# Patient Record
Sex: Female | Born: 2000 | Race: White | Hispanic: No | Marital: Single | Smoking: Never smoker
Health system: Southern US, Community
[De-identification: ages and names within clinical notes are randomized; demographics above are authoritative.]

---
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
## 2020-07-24 DIAGNOSIS — R059 Cough, unspecified: Secondary | ICD-10-CM | POA: Diagnosis not present

## 2020-08-14 DIAGNOSIS — B9689 Other specified bacterial agents as the cause of diseases classified elsewhere: Secondary | ICD-10-CM | POA: Diagnosis not present

## 2020-08-14 DIAGNOSIS — L700 Acne vulgaris: Secondary | ICD-10-CM | POA: Diagnosis not present

## 2020-08-14 DIAGNOSIS — L02828 Furuncle of other sites: Secondary | ICD-10-CM | POA: Diagnosis not present

## 2022-03-27 ENCOUNTER — Ambulatory Visit (INDEPENDENT_AMBULATORY_CARE_PROVIDER_SITE_OTHER): Payer: BC Managed Care – PPO | Admitting: Family Medicine

## 2022-03-27 ENCOUNTER — Encounter: Payer: Self-pay | Admitting: Family Medicine

## 2022-03-27 VITALS — BP 136/83 | HR 80 | Ht 64.0 in | Wt 242.1 lb

## 2022-03-27 DIAGNOSIS — K5900 Constipation, unspecified: Secondary | ICD-10-CM | POA: Insufficient documentation

## 2022-03-27 DIAGNOSIS — Z1159 Encounter for screening for other viral diseases: Secondary | ICD-10-CM

## 2022-03-27 DIAGNOSIS — K5904 Chronic idiopathic constipation: Secondary | ICD-10-CM | POA: Diagnosis not present

## 2022-03-27 DIAGNOSIS — Z114 Encounter for screening for human immunodeficiency virus [HIV]: Secondary | ICD-10-CM | POA: Diagnosis not present

## 2022-03-27 DIAGNOSIS — Z2821 Immunization not carried out because of patient refusal: Secondary | ICD-10-CM | POA: Diagnosis not present

## 2022-03-27 DIAGNOSIS — Z23 Encounter for immunization: Secondary | ICD-10-CM

## 2022-03-27 DIAGNOSIS — E559 Vitamin D deficiency, unspecified: Secondary | ICD-10-CM

## 2022-03-27 DIAGNOSIS — R7301 Impaired fasting glucose: Secondary | ICD-10-CM

## 2022-03-27 DIAGNOSIS — Z5320 Procedure and treatment not carried out because of patient's decision for unspecified reasons: Secondary | ICD-10-CM

## 2022-03-27 NOTE — Patient Instructions (Addendum)
.  I appreciate the opportunity to provide care to you today!    Follow up:  3 months  Labs: please stop by the lab during the week to get your blood drawn (CBC, CMP, TSH, Lipid profile, HgA1c, Vit D)  Screening: HIV and Hep C    Please continue to a heart-healthy diet and increase your physical activities. Try to exercise for at least three times a week.      It was a pleasure to see you and I look forward to continuing to work together on your health and well-being. Please do not hesitate to call the office if you need care or have questions about your care.   Have a wonderful day and week. With Gratitude, Gilmore Laroche MSN, FNP-BC

## 2022-03-27 NOTE — Progress Notes (Signed)
New Patient Office Visit  Subjective:  Patient ID: Vickie Craig, female    DOB: [birth year]  Age: 21 y.o. MRN: [id number]  CC:  Chief Complaint  Patient presents with   New Patient (Initial Visit)    Establishing care. Due for pap, has questions about this. C/o constipation problems.     HPI Vickie Craig is a 21 y.o. female with past medical history of idiopathic constipation presents for establishing care. Pap smears: She reports not wanting a pap exam, stating that she has been getting pap exams since she was 21 years old during her GYN annual.  Chronic idiopathic constipation: c/o of constipation of unknown etiology. She noted imaging studies showing impacted stools in her colon when receiving care in [place]. She reported a bowel movement today and was noted to have BM once a week. She reports taking Dulcolax for symptom relief.  History reviewed. No pertinent past medical history.  History reviewed. No pertinent surgical history.  History reviewed. No pertinent family history.  Social History   Socioeconomic History   Marital status: Single    Spouse name: Not on file   Number of children: Not on file   Years of education: Not on file   Highest education level: Not on file  Occupational History   Not on file  Tobacco Use   Smoking status: Never   Smokeless tobacco: Not on file  Substance and Sexual Activity   Alcohol use: No   Drug use: No   Sexual activity: Not on file  Other Topics Concern   Not on file  Social History Narrative   Not on file   Social Determinants of Health   Financial Resource Strain: Not on file  Food Insecurity: Not on file  Transportation Needs: Not on file  Physical Activity: Not on file  Stress: Not on file  Social Connections: Not on file  Intimate Partner Violence: Not on file    ROS Review of Systems  Constitutional:  Negative for fatigue and fever.  HENT:  Negative for sinus pressure, sinus pain and sore throat.    Eyes:  Negative for photophobia, redness and visual disturbance.  Respiratory:  Negative for chest tightness and shortness of breath.   Cardiovascular:  Negative for chest pain and palpitations.  Gastrointestinal:  Negative for abdominal distention and abdominal pain.  Endocrine: Negative for polydipsia, polyphagia and polyuria.  Genitourinary:  Negative for decreased urine volume, vaginal bleeding and vaginal discharge.  Musculoskeletal:  Negative for gait problem and neck pain.  Skin:  Negative for rash and wound.  Neurological:  Negative for dizziness, weakness and headaches.  Hematological:  Does not bruise/bleed easily.  Psychiatric/Behavioral:  Negative for self-injury and suicidal ideas.     Objective:   Today's Vitals: BP 136/83   Pulse 80   Ht 5' 4"  (1.626 m)   Wt 242 lb 1.9 oz (109.8 kg)   LMP 03/06/2022   SpO2 98%   BMI 41.56 kg/m   Physical Exam HENT:     Head: Normocephalic.     Right Ear: External ear normal.     Left Ear: External ear normal.     Nose: No congestion.     Mouth/Throat:     Pharynx: No oropharyngeal exudate.  Eyes:     Extraocular Movements: Extraocular movements intact.     Pupils: Pupils are equal, round, and reactive to light.  Cardiovascular:     Rate and Rhythm: Normal rate and regular rhythm.  Pulses: Normal pulses.     Heart sounds: Normal heart sounds.  Pulmonary:     Effort: Pulmonary effort is normal.  Abdominal:     Palpations: Abdomen is soft.     Tenderness: There is no right CVA tenderness or left CVA tenderness.  Musculoskeletal:     Cervical back: No rigidity.     Right lower leg: No edema.     Left lower leg: No edema.  Skin:    General: Skin is warm and dry.  Neurological:     Mental Status: She is alert and oriented to person, place, and time.  Psychiatric:     Comments: Normal affect     Assessment & Plan:   Problem List Items Addressed This Visit       Other   Constipation - Primary    Encouraged  to continue using Dulcolax as needed for symptom relief Encouraged her to continue increasing her fluids and fiber intake       Cervical cancer screening declined    She reports not wanting a pap exam, stating that she has been getting pap exams since she was 21 years old during her GYN annual  informed the patient that screening is every 3 years to assess for changes in the cervical cells Pt verbalized understanding and declined screening       Other Visit Diagnoses     Immunization due       Relevant Orders   Tdap vaccine greater than or equal to 7yo IM (Completed)   Refused influenza vaccine       Need for hepatitis C screening test       Relevant Orders   Hepatitis C Antibody   Encounter for screening for HIV       Relevant Orders   HIV antibody (with reflex)   Vitamin D deficiency       Relevant Orders   VITAMIN D 25 Hydroxy (Vit-D Deficiency, Fractures)   IFG (impaired fasting glucose)       Relevant Orders   CBC with Differential/Platelet   TSH + free T4   Lipid panel   Hemoglobin A1C   CMP14+EGFR       No outpatient encounter medications on file as of 03/27/2022.   No facility-administered encounter medications on file as of 03/27/2022.    Follow-up: Return in about 3 months (around 06/26/2022).   Alvira Monday, FNP

## 2022-03-27 NOTE — Assessment & Plan Note (Signed)
She reports not wanting a pap exam, stating that she has been getting pap exams since she was 21 years old during her GYN annual  informed the patient that screening is every 3 years to assess for changes in the cervical cells Pt verbalized understanding and declined screening

## 2022-03-27 NOTE — Assessment & Plan Note (Addendum)
Encouraged to continue using Dulcolax as needed for symptom relief Encouraged her to continue increasing her fluids and fiber intake

## 2022-06-26 ENCOUNTER — Ambulatory Visit: Payer: BC Managed Care – PPO | Admitting: Family Medicine

## 2022-07-08 ENCOUNTER — Encounter: Payer: Self-pay | Admitting: Family Medicine

## 2022-07-08 ENCOUNTER — Ambulatory Visit (INDEPENDENT_AMBULATORY_CARE_PROVIDER_SITE_OTHER): Payer: BC Managed Care – PPO | Admitting: Family Medicine

## 2022-07-08 VITALS — BP 119/78 | HR 92 | Ht 64.0 in | Wt 238.1 lb

## 2022-07-08 DIAGNOSIS — R112 Nausea with vomiting, unspecified: Secondary | ICD-10-CM

## 2022-07-08 DIAGNOSIS — R1084 Generalized abdominal pain: Secondary | ICD-10-CM | POA: Diagnosis not present

## 2022-07-08 DIAGNOSIS — Z1159 Encounter for screening for other viral diseases: Secondary | ICD-10-CM | POA: Diagnosis not present

## 2022-07-08 DIAGNOSIS — R7301 Impaired fasting glucose: Secondary | ICD-10-CM | POA: Diagnosis not present

## 2022-07-08 DIAGNOSIS — E559 Vitamin D deficiency, unspecified: Secondary | ICD-10-CM | POA: Diagnosis not present

## 2022-07-08 DIAGNOSIS — R109 Unspecified abdominal pain: Secondary | ICD-10-CM | POA: Insufficient documentation

## 2022-07-08 DIAGNOSIS — Z114 Encounter for screening for human immunodeficiency virus [HIV]: Secondary | ICD-10-CM | POA: Diagnosis not present

## 2022-07-08 LAB — POCT URINALYSIS DIP (CLINITEK)
Bilirubin, UA: NEGATIVE
Blood, UA: NEGATIVE
Glucose, UA: NEGATIVE mg/dL
Ketones, POC UA: NEGATIVE mg/dL
Nitrite, UA: NEGATIVE
POC PROTEIN,UA: NEGATIVE
Spec Grav, UA: 1.02 (ref 1.010–1.025)
Urobilinogen, UA: 0.2 E.U./dL
pH, UA: 6.5 (ref 5.0–8.0)

## 2022-07-08 MED ORDER — OMEPRAZOLE 20 MG PO CPDR: 20.0000 mg | DELAYED_RELEASE_CAPSULE | Freq: Every day | ORAL | 3 refills | Status: DC

## 2022-07-08 MED ORDER — ONDANSETRON HCL 4 MG PO TABS: 4.0000 mg | ORAL_TABLET | Freq: Three times a day (TID) | ORAL | 0 refills | Status: AC | PRN

## 2022-07-08 NOTE — Assessment & Plan Note (Signed)
UA is negative for cystitis We will start a trial of PPI Zofran was ordered for nausea and vomiting Encouraged to call to schedule an appointment for worsening symptoms

## 2022-07-08 NOTE — Progress Notes (Signed)
Acute Office Visit  Subjective:    Patient ID: Vickie Craig, female    DOB: 2001/02/14, 21 y.o.   MRN: [id number]  Chief Complaint  Patient presents with   Abdominal Pain    Pt reports stabbing pain around her belly button every time she eats she either feels nauseous or pain comes on and stools have been abnormal, has been using pepto bismol and other otc meds nothing helps , onset of sx began around 06/11/2022.    HPI Patient is in today with complaints of abdominal pain.  Abdominal pain: She denies fever and chills.  She complains of a stabbing pain around her umbilicus with the onset of food.  She complains of nausea and vomiting with symptoms.  She reports that her pain intensifies with the onset of  bowel movement.  History reviewed. No pertinent past medical history.  History reviewed. No pertinent surgical history.  History reviewed. No pertinent family history.  Social History   Socioeconomic History   Marital status: Single    Spouse name: Not on file   Number of children: Not on file   Years of education: Not on file   Highest education level: Not on file  Occupational History   Not on file  Tobacco Use   Smoking status: Never   Smokeless tobacco: Not on file  Substance and Sexual Activity   Alcohol use: No   Drug use: No   Sexual activity: Not on file  Other Topics Concern   Not on file  Social History Narrative   Not on file   Social Determinants of Health   Financial Resource Strain: Not on file  Food Insecurity: Not on file  Transportation Needs: Not on file  Physical Activity: Not on file  Stress: Not on file  Social Connections: Not on file  Intimate Partner Violence: Not on file    No outpatient medications prior to visit.   No facility-administered medications prior to visit.    No Known Allergies  Review of Systems  Constitutional:  Negative for chills and fever.  Eyes:  Negative for visual disturbance.  Respiratory:  Negative  for chest tightness and shortness of breath.   Gastrointestinal:  Positive for abdominal pain, nausea and vomiting.  Musculoskeletal:  Negative for back pain.  Neurological:  Negative for dizziness and headaches.       Objective:    Physical Exam HENT:     Head: Normocephalic.     Mouth/Throat:     Mouth: Mucous membranes are moist.  Cardiovascular:     Rate and Rhythm: Normal rate.     Heart sounds: Normal heart sounds.  Pulmonary:     Effort: Pulmonary effort is normal.     Breath sounds: Normal breath sounds.  Abdominal:     Tenderness: There is no abdominal tenderness. There is no right CVA tenderness or left CVA tenderness.  Neurological:     Mental Status: She is alert.     BP 119/78   Pulse 92   Ht 5\' 4"  (1.626 m)   Wt 238 lb 1.3 oz (108 kg)   SpO2 97%   BMI 40.87 kg/m  Wt Readings from Last 3 Encounters:  07/08/22 238 lb 1.3 oz (108 kg)  03/27/22 242 lb 1.9 oz (109.8 kg)  03/29/15 215 lb 12.8 oz (97.9 kg) (99 %, Z= 2.22)*   * Growth percentiles are based on CDC (Girls, 2-20 Years) data.       Assessment & Plan:  Generalized abdominal pain Assessment & Plan: UA is negative for cystitis We will start a trial of PPI Zofran was ordered for nausea and vomiting Encouraged to call to schedule an appointment for worsening symptoms  Orders: -     POCT URINALYSIS DIP (CLINITEK) -     Omeprazole; Take 1 capsule (20 mg total) by mouth daily.  Dispense: 30 capsule; Refill: 3  Nausea and vomiting, unspecified vomiting type -     Ondansetron HCl; Take 1 tablet (4 mg total) by mouth every 8 (eight) hours as needed for nausea or vomiting.  Dispense: 30 tablet; Refill: 0    Gilmore Laroche, FNP

## 2022-07-08 NOTE — Patient Instructions (Addendum)
I appreciate the opportunity to provide care to you today!    Follow up:  3 months  A prescription for Zofran 4 mg to take every 8 hours as needed for nausea and vomiting has been sent to your pharmacy  I will start you on a trial of omeprazole 20 mg daily to take epigastric domino pain  I recommend staying well-hydrated and increasing her fluid intake, at least 64 ounces of fluids daily  I also recommend taking over-the-counter metamucil fiber for constipation 2.5 to 30 g/day    Please continue to a heart-healthy diet and increase your physical activities. Try to exercise for at least three times a week.      It was a pleasure to see you and I look forward to continuing to work together on your health and well-being. Please do not hesitate to call the office if you need care or have questions about your care.   Have a wonderful day and week. With Gratitude, Gilmore Laroche MSN, FNP-BC

## 2022-07-09 LAB — CMP14+EGFR
ALT: 20 IU/L (ref 0–32)
AST: 13 IU/L (ref 0–40)
Albumin/Globulin Ratio: 2.1 (ref 1.2–2.2)
Albumin: 4.9 g/dL (ref 4.0–5.0)
Alkaline Phosphatase: 90 IU/L (ref 44–121)
BUN/Creatinine Ratio: 15 (ref 9–23)
BUN: 12 mg/dL (ref 6–20)
Bilirubin Total: 0.4 mg/dL (ref 0.0–1.2)
CO2: 23 mmol/L (ref 20–29)
Calcium: 10 mg/dL (ref 8.7–10.2)
Chloride: 99 mmol/L (ref 96–106)
Creatinine, Ser: 0.78 mg/dL (ref 0.57–1.00)
Globulin, Total: 2.3 g/dL (ref 1.5–4.5)
Glucose: 93 mg/dL (ref 70–99)
Potassium: 4.8 mmol/L (ref 3.5–5.2)
Sodium: 138 mmol/L (ref 134–144)
Total Protein: 7.2 g/dL (ref 6.0–8.5)
eGFR: 109 mL/min/{1.73_m2} (ref >59)

## 2022-07-09 LAB — CBC WITH DIFFERENTIAL/PLATELET
Basophils Absolute: 0 10*3/uL (ref 0.0–0.2)
Basos: 1 %
EOS (ABSOLUTE): 0.1 10*3/uL (ref 0.0–0.4)
Eos: 1 %
Hematocrit: 41.1 % (ref 34.0–46.6)
Hemoglobin: 13.2 g/dL (ref 11.1–15.9)
Immature Grans (Abs): 0 10*3/uL (ref 0.0–0.1)
Immature Granulocytes: 0 %
Lymphocytes Absolute: 1.5 10*3/uL (ref 0.7–3.1)
Lymphs: 23 %
MCH: 26.9 pg (ref 26.6–33.0)
MCHC: 32.1 g/dL (ref 31.5–35.7)
MCV: 84 fL (ref 79–97)
Monocytes Absolute: 0.5 10*3/uL (ref 0.1–0.9)
Monocytes: 7 %
Neutrophils Absolute: 4.5 10*3/uL (ref 1.4–7.0)
Neutrophils: 68 %
Platelets: 249 10*3/uL (ref 150–450)
RBC: 4.9 x10E6/uL (ref 3.77–5.28)
RDW: 13.2 % (ref 11.7–15.4)
WBC: 6.6 10*3/uL (ref 3.4–10.8)

## 2022-07-09 LAB — LIPID PANEL
Chol/HDL Ratio: 3.3 ratio (ref 0.0–4.4)
Cholesterol, Total: 169 mg/dL (ref 100–199)
HDL: 52 mg/dL (ref >39)
LDL Chol Calc (NIH): 102 mg/dL — ABNORMAL HIGH (ref 0–99)
Triglycerides: 80 mg/dL (ref 0–149)
VLDL Cholesterol Cal: 15 mg/dL (ref 5–40)

## 2022-07-09 LAB — TSH+FREE T4
Free T4: 1.22 ng/dL (ref 0.82–1.77)
TSH: 1.29 u[IU]/mL (ref 0.450–4.500)

## 2022-07-09 LAB — VITAMIN D 25 HYDROXY (VIT D DEFICIENCY, FRACTURES): Vit D, 25-Hydroxy: 20.5 ng/mL — ABNORMAL LOW (ref 30.0–100.0)

## 2022-07-09 LAB — HIV ANTIBODY (ROUTINE TESTING W REFLEX): HIV Screen 4th Generation wRfx: NONREACTIVE

## 2022-07-09 LAB — HEPATITIS C ANTIBODY: Hep C Virus Ab: NONREACTIVE

## 2022-07-09 LAB — HEMOGLOBIN A1C
Est. average glucose Bld gHb Est-mCnc: 100 mg/dL
Hgb A1c MFr Bld: 5.1 % (ref 4.8–5.6)

## 2022-07-14 NOTE — Progress Notes (Signed)
Please inform the patient that her vitamin D is slightly low; I recommend that she start taking vitamin D at 1000 IU D daily over-the-counter.  Her LDL cholesterol is slightly elevated; I recommend a low carbs and saturated fat diet with increased physical activities.  All other labs are stable

## 2022-07-21 ENCOUNTER — Other Ambulatory Visit: Payer: Self-pay | Admitting: Family Medicine

## 2022-07-21 ENCOUNTER — Encounter: Payer: Self-pay | Admitting: Family Medicine

## 2022-07-21 DIAGNOSIS — E559 Vitamin D deficiency, unspecified: Secondary | ICD-10-CM

## 2022-07-21 MED ORDER — VITAMIN D (ERGOCALCIFEROL) 1.25 MG (50000 UNIT) PO CAPS: 50000.0000 [IU] | ORAL_CAPSULE | ORAL | 2 refills | Status: AC

## 2022-07-28 ENCOUNTER — Ambulatory Visit: Payer: BC Managed Care – PPO | Admitting: Family Medicine

## 2022-10-07 ENCOUNTER — Ambulatory Visit: Payer: BC Managed Care – PPO | Admitting: Family Medicine

## 2022-10-21 ENCOUNTER — Encounter: Payer: Self-pay | Admitting: Family Medicine

## 2022-10-21 ENCOUNTER — Ambulatory Visit (INDEPENDENT_AMBULATORY_CARE_PROVIDER_SITE_OTHER): Payer: BC Managed Care – PPO | Admitting: Family Medicine

## 2022-10-21 VITALS — BP 132/84 | HR 95 | Ht 64.0 in | Wt 238.0 lb

## 2022-10-21 DIAGNOSIS — K5909 Other constipation: Secondary | ICD-10-CM | POA: Diagnosis not present

## 2022-10-21 DIAGNOSIS — R1084 Generalized abdominal pain: Secondary | ICD-10-CM

## 2022-10-21 DIAGNOSIS — R7301 Impaired fasting glucose: Secondary | ICD-10-CM | POA: Diagnosis not present

## 2022-10-21 DIAGNOSIS — E7849 Other hyperlipidemia: Secondary | ICD-10-CM

## 2022-10-21 DIAGNOSIS — E559 Vitamin D deficiency, unspecified: Secondary | ICD-10-CM | POA: Diagnosis not present

## 2022-10-21 MED ORDER — OMEPRAZOLE 40 MG PO CPDR
40.0000 mg | DELAYED_RELEASE_CAPSULE | Freq: Every day | ORAL | 3 refills | Status: AC
Start: 2022-10-21 — End: ?

## 2022-10-21 NOTE — Patient Instructions (Addendum)
I appreciate the opportunity to provide care to you today!    Follow up:  6 weeks  +  Labs: please stop by the lab during the week to get your blood drawn (CBC, CMP, TSH, Lipid profile, HgA1c, Vit D)  Please pick up your medication at the pharmacy   Please continue to a heart-healthy diet and increase your physical activities. Try to exercise for 42mins at least five days a week.      It was a pleasure to see you and I look forward to continuing to work together on your health and well-being. Please do not hesitate to call the office if you need care or have questions about your care.   Have a wonderful day and week. With Gratitude, Alvira Monday MSN, FNP-BC

## 2022-10-21 NOTE — Progress Notes (Signed)
Established Patient Office Visit  Subjective:  Patient ID: Vickie Craig, female    DOB: 2001/01/14  Age: 22 y.o. MRN: [id number]  CC:  Chief Complaint  Patient presents with   Follow-up    3 month f/u, pt reports still having abdominal pain around mid area worsened since thanksgiving.     HPI Vickie Craig is a 21 y.o. female with past medical history of constipation and abdominal pain presents for f/u of  chronic medical conditions.  History reviewed. No pertinent past medical history.  History reviewed. No pertinent surgical history.  History reviewed. No pertinent family history.  Social History   Socioeconomic History   Marital status: Single    Spouse name: Not on file   Number of children: Not on file   Years of education: Not on file   Highest education level: Not on file  Occupational History   Not on file  Tobacco Use   Smoking status: Never   Smokeless tobacco: Not on file  Substance and Sexual Activity   Alcohol use: No   Drug use: No   Sexual activity: Not on file  Other Topics Concern   Not on file  Social History Narrative   Not on file   Social Determinants of Health   Financial Resource Strain: Not on file  Food Insecurity: Not on file  Transportation Needs: Not on file  Physical Activity: Not on file  Stress: Not on file  Social Connections: Not on file  Intimate Partner Violence: Not on file    Outpatient Medications Prior to Visit  Medication Sig Dispense Refill   ondansetron (ZOFRAN) 4 MG tablet Take 1 tablet (4 mg total) by mouth every 8 (eight) hours as needed for nausea or vomiting. 30 tablet 0   Vitamin D, Ergocalciferol, (DRISDOL) 1.25 MG (50000 UNIT) CAPS capsule Take 1 capsule (50,000 Units total) by mouth every 7 (seven) days. 10 capsule 2   omeprazole (PRILOSEC) 20 MG capsule Take 1 capsule (20 mg total) by mouth daily. 30 capsule 3   No facility-administered medications prior to visit.    No Known  Allergies  ROS Review of Systems  Constitutional:  Negative for chills, fatigue and fever.  Respiratory:  Negative for shortness of breath.   Cardiovascular:  Negative for chest pain and palpitations.  Gastrointestinal:  Positive for abdominal pain. Negative for abdominal distention, blood in stool, constipation, nausea and vomiting.  Endocrine: Negative for polydipsia, polyphagia and polyuria.  Genitourinary:  Negative for frequency and urgency.      Objective:    Physical Exam HENT:     Head: Normocephalic.     Mouth/Throat:     Mouth: Mucous membranes are moist.  Cardiovascular:     Rate and Rhythm: Normal rate.     Heart sounds: Normal heart sounds.  Pulmonary:     Effort: Pulmonary effort is normal.     Breath sounds: Normal breath sounds.  Neurological:     Mental Status: She is alert.     BP 132/84   Pulse 95   Ht 5\' 4"  (1.626 m)   Wt 238 lb (108 kg)   SpO2 97%   BMI 40.85 kg/m  Wt Readings from Last 3 Encounters:  10/21/22 238 lb (108 kg)  07/08/22 238 lb 1.3 oz (108 kg)  03/27/22 242 lb 1.9 oz (109.8 kg)    Lab Results  Component Value Date   TSH 1.290 07/08/2022   Lab Results  Component Value Date  WBC 6.6 07/08/2022   HGB 13.2 07/08/2022   HCT 41.1 07/08/2022   MCV 84 07/08/2022   PLT 249 07/08/2022   Lab Results  Component Value Date   NA 138 07/08/2022   K 4.8 07/08/2022   CO2 23 07/08/2022   GLUCOSE 93 07/08/2022   BUN 12 07/08/2022   CREATININE 0.78 07/08/2022   BILITOT 0.4 07/08/2022   ALKPHOS 90 07/08/2022   AST 13 07/08/2022   ALT 20 07/08/2022   PROT 7.2 07/08/2022   ALBUMIN 4.9 07/08/2022   CALCIUM 10.0 07/08/2022   EGFR 109 07/08/2022   Lab Results  Component Value Date   CHOL 169 07/08/2022   Lab Results  Component Value Date   HDL 52 07/08/2022   Lab Results  Component Value Date   LDLCALC 102 (H) 07/08/2022   Lab Results  Component Value Date   TRIG 80 07/08/2022   Lab Results  Component Value Date    CHOLHDL 3.3 07/08/2022   Lab Results  Component Value Date   HGBA1C 5.1 07/08/2022      Assessment & Plan:  Generalized abdominal pain Assessment & Plan: Complains of lower abdominal pain since Thanksgiving Negative rebound tenderness, negative psoas sign, negative obturator sign Symptom likely due to increased stomach acid Patient reports family history of colon cancer Reports some relief of her symptoms with omeprazole 20 mg daily Will increase omeprazole to 40 mg daily Denies nausea, vomiting, fever, chills, and changes in bowel habits Will get imaging studies due to ongoing discomfort in the abdomen and patient reports of family history of colon cancer  Orders: -     CT ABDOMEN PELVIS W WO CONTRAST -     Omeprazole; Take 1 capsule (40 mg total) by mouth daily.  Dispense: 30 capsule; Refill: 3  Other constipation Assessment & Plan: Stable Reports having bowel movements every other day No complaints of concerns voiced   IFG (impaired fasting glucose) -     Hemoglobin A1c  Vitamin D deficiency -     VITAMIN D 25 Hydroxy (Vit-D Deficiency, Fractures)  Other hyperlipidemia -     TSH + free T4 -     Lipid panel -     CMP14+EGFR -     CBC with Differential/Platelet    Follow-up: Return in about 6 weeks (around 12/02/2022).   Alvira Monday, FNP

## 2022-10-21 NOTE — Assessment & Plan Note (Signed)
Complains of lower abdominal pain since Thanksgiving Negative rebound tenderness, negative psoas sign, negative obturator sign Symptom likely due to increased stomach acid Patient reports family history of colon cancer Reports some relief of her symptoms with omeprazole 20 mg daily Will increase omeprazole to 40 mg daily Denies nausea, vomiting, fever, chills, and changes in bowel habits Will get imaging studies due to ongoing discomfort in the abdomen and patient reports of family history of colon cancer

## 2022-10-21 NOTE — Assessment & Plan Note (Signed)
Stable Reports having bowel movements every other day No complaints of concerns voiced

## 2022-10-28 ENCOUNTER — Other Ambulatory Visit: Payer: Self-pay

## 2022-10-28 ENCOUNTER — Telehealth: Payer: Self-pay | Admitting: Family Medicine

## 2022-10-28 DIAGNOSIS — R1084 Generalized abdominal pain: Secondary | ICD-10-CM

## 2022-10-28 DIAGNOSIS — K5904 Chronic idiopathic constipation: Secondary | ICD-10-CM

## 2022-10-28 NOTE — Telephone Encounter (Signed)
Your request for CT Abdomen/Pelvis Combination does not meet medical necessity criteria based on the information provided. Please review the Clinical Criteria information specific to this exam below.   Exam CT Abdomen/Pelvis Combination    CLINICAL CRITERIA  The criteria below may help you determine if this exam is clinically appropriate.  Advanced imaging of both abdomen and pelvis may be indicated in certain clinical scenarios. For symptoms or findings localized to the upper abdomen or to the pelvis, combination imaging is generally not necessary.

## 2022-10-29 NOTE — Telephone Encounter (Signed)
Thank you :)

## 2022-11-01 NOTE — Progress Notes (Deleted)
Referring Provider: Gilmore Laroche, FNP Primary Care Physician:  Gilmore Laroche, FNP Primary Gastroenterologist:  Dr. Bonnetta Barry chief complaint on file.   HPI:   Vickie Craig is a 22 y.o. female presenting today at the request of Gilmore Laroche, FNP for generalized abdominal pain and chronic idiopathic constipation.  Patient saw PCP 10/21/2022 for lower abdominal pain since Thanksgiving.  Patient reported some relief with omeprazole 20 mg daily.  Omeprazole was increased to 40 mg daily. CT A/P with and without contrast was ordered along with labs including CBC, CMP, TSH, vitamin D, hemoglobin A1c, lipid panel, but have not been completed.  Last labs on file 07/08/2022.  No significant abnormalities on CBC, CMP.  Today:   No past medical history on file.  No past surgical history on file.  Current Outpatient Medications  Medication Sig Dispense Refill   omeprazole (PRILOSEC) 40 MG capsule Take 1 capsule (40 mg total) by mouth daily. 30 capsule 3   ondansetron (ZOFRAN) 4 MG tablet Take 1 tablet (4 mg total) by mouth every 8 (eight) hours as needed for nausea or vomiting. 30 tablet 0   Vitamin D, Ergocalciferol, (DRISDOL) 1.25 MG (50000 UNIT) CAPS capsule Take 1 capsule (50,000 Units total) by mouth every 7 (seven) days. 10 capsule 2   No current facility-administered medications for this visit.    Allergies as of 11/04/2022   (No Known Allergies)    No family history on file.  Social History   Socioeconomic History   Marital status: Single    Spouse name: Not on file   Number of children: Not on file   Years of education: Not on file   Highest education level: Not on file  Occupational History   Not on file  Tobacco Use   Smoking status: Never   Smokeless tobacco: Not on file  Substance and Sexual Activity   Alcohol use: No   Drug use: No   Sexual activity: Not on file  Other Topics Concern   Not on file  Social History Narrative   Not on file   Social  Determinants of Health   Financial Resource Strain: Not on file  Food Insecurity: Not on file  Transportation Needs: Not on file  Physical Activity: Not on file  Stress: Not on file  Social Connections: Not on file  Intimate Partner Violence: Not on file    Review of Systems: Gen: Denies any fever, chills, fatigue, weight loss, lack of appetite.  CV: Denies chest pain, heart palpitations, peripheral edema, syncope.  Resp: Denies shortness of breath at rest or with exertion. Denies wheezing or cough.  GI: Denies dysphagia or odynophagia. Denies jaundice, hematemesis, fecal incontinence. GU : Denies urinary burning, urinary frequency, urinary hesitancy MS: Denies joint pain, muscle weakness, cramps, or limitation of movement.  Derm: Denies rash, itching, dry skin Psych: Denies depression, anxiety, memory loss, and confusion Heme: Denies bruising, bleeding, and enlarged lymph nodes.  Physical Exam: There were no vitals taken for this visit. General:   Alert and oriented. Pleasant and cooperative. Well-nourished and well-developed.  Head:  Normocephalic and atraumatic. Eyes:  Without icterus, sclera clear and conjunctiva pink.  Ears:  Normal auditory acuity. Lungs:  Clear to auscultation bilaterally. No wheezes, rales, or rhonchi. No distress.  Heart:  S1, S2 present without murmurs appreciated.  Abdomen:  +BS, soft, non-tender and non-distended. No HSM noted. No guarding or rebound. No masses appreciated.  Rectal:  Deferred  Msk:  Symmetrical without gross deformities.  Normal posture. Extremities:  Without edema. Neurologic:  Alert and  oriented x4;  grossly normal neurologically. Skin:  Intact without significant lesions or rashes. Psych:  Alert and cooperative. Normal mood and affect.    Assessment:     Plan:  ***   Ermalinda Memos, PA-C [facility] 11/04/2022

## 2022-11-02 ENCOUNTER — Ambulatory Visit (HOSPITAL_BASED_OUTPATIENT_CLINIC_OR_DEPARTMENT_OTHER): Payer: BC Managed Care – PPO

## 2022-11-02 DIAGNOSIS — E7849 Other hyperlipidemia: Secondary | ICD-10-CM | POA: Diagnosis not present

## 2022-11-02 DIAGNOSIS — E559 Vitamin D deficiency, unspecified: Secondary | ICD-10-CM | POA: Diagnosis not present

## 2022-11-02 DIAGNOSIS — R7301 Impaired fasting glucose: Secondary | ICD-10-CM | POA: Diagnosis not present

## 2022-11-03 LAB — VITAMIN D 25 HYDROXY (VIT D DEFICIENCY, FRACTURES): Vit D, 25-Hydroxy: 17.2 ng/mL — ABNORMAL LOW (ref 30.0–100.0)

## 2022-11-03 LAB — CMP14+EGFR
ALT: 26 IU/L (ref 0–32)
AST: 16 IU/L (ref 0–40)
Albumin/Globulin Ratio: 1.9 (ref 1.2–2.2)
Albumin: 4.3 g/dL (ref 4.0–5.0)
Alkaline Phosphatase: 95 IU/L (ref 44–121)
BUN/Creatinine Ratio: 15 (ref 9–23)
BUN: 11 mg/dL (ref 6–20)
Bilirubin Total: 0.3 mg/dL (ref 0.0–1.2)
CO2: 23 mmol/L (ref 20–29)
Calcium: 9.8 mg/dL (ref 8.7–10.2)
Chloride: 103 mmol/L (ref 96–106)
Creatinine, Ser: 0.72 mg/dL (ref 0.57–1.00)
Globulin, Total: 2.3 g/dL (ref 1.5–4.5)
Glucose: 97 mg/dL (ref 70–99)
Potassium: 4.3 mmol/L (ref 3.5–5.2)
Sodium: 139 mmol/L (ref 134–144)
Total Protein: 6.6 g/dL (ref 6.0–8.5)
eGFR: 120 mL/min/{1.73_m2} (ref >59)

## 2022-11-03 LAB — CBC WITH DIFFERENTIAL/PLATELET
Basophils Absolute: 0.1 10*3/uL (ref 0.0–0.2)
Basos: 1 %
EOS (ABSOLUTE): 0.2 10*3/uL (ref 0.0–0.4)
Eos: 3 %
Hematocrit: 39.8 % (ref 34.0–46.6)
Hemoglobin: 12.4 g/dL (ref 11.1–15.9)
Immature Grans (Abs): 0 10*3/uL (ref 0.0–0.1)
Immature Granulocytes: 0 %
Lymphocytes Absolute: 2 10*3/uL (ref 0.7–3.1)
Lymphs: 30 %
MCH: 25.9 pg — ABNORMAL LOW (ref 26.6–33.0)
MCHC: 31.2 g/dL — ABNORMAL LOW (ref 31.5–35.7)
MCV: 83 fL (ref 79–97)
Monocytes Absolute: 0.5 10*3/uL (ref 0.1–0.9)
Monocytes: 8 %
Neutrophils Absolute: 3.9 10*3/uL (ref 1.4–7.0)
Neutrophils: 58 %
Platelets: 258 10*3/uL (ref 150–450)
RBC: 4.79 x10E6/uL (ref 3.77–5.28)
RDW: 13.5 % (ref 11.7–15.4)
WBC: 6.7 10*3/uL (ref 3.4–10.8)

## 2022-11-03 LAB — LIPID PANEL
Chol/HDL Ratio: 3 ratio (ref 0.0–4.4)
Cholesterol, Total: 176 mg/dL (ref 100–199)
HDL: 58 mg/dL (ref >39)
LDL Chol Calc (NIH): 102 mg/dL — ABNORMAL HIGH (ref 0–99)
Triglycerides: 87 mg/dL (ref 0–149)
VLDL Cholesterol Cal: 16 mg/dL (ref 5–40)

## 2022-11-03 LAB — TSH+FREE T4
Free T4: 1.21 ng/dL (ref 0.82–1.77)
TSH: 2.95 u[IU]/mL (ref 0.450–4.500)

## 2022-11-03 LAB — HEMOGLOBIN A1C
Est. average glucose Bld gHb Est-mCnc: 103 mg/dL
Hgb A1c MFr Bld: 5.2 % (ref 4.8–5.6)

## 2022-11-04 ENCOUNTER — Ambulatory Visit: Payer: BC Managed Care – PPO | Admitting: Gastroenterology

## 2022-11-10 ENCOUNTER — Telehealth: Payer: Self-pay | Admitting: Family Medicine

## 2022-11-10 NOTE — Telephone Encounter (Signed)
Patient called said she was referred to [facility], she has tried calling now for 2 weeks and can not get in touch or schedule an appointment, patient is asking can she be referred to another [place]. Patient call back # [phone]

## 2022-11-30 ENCOUNTER — Encounter: Payer: Self-pay | Admitting: Gastroenterology

## 2022-11-30 ENCOUNTER — Ambulatory Visit (INDEPENDENT_AMBULATORY_CARE_PROVIDER_SITE_OTHER): Payer: BC Managed Care – PPO | Admitting: Gastroenterology

## 2022-11-30 VITALS — BP 129/84 | HR 69 | Temp 98.6°F | Ht 63.0 in | Wt 243.8 lb

## 2022-11-30 DIAGNOSIS — R1030 Lower abdominal pain, unspecified: Secondary | ICD-10-CM

## 2022-11-30 DIAGNOSIS — K219 Gastro-esophageal reflux disease without esophagitis: Secondary | ICD-10-CM

## 2022-11-30 DIAGNOSIS — K5904 Chronic idiopathic constipation: Secondary | ICD-10-CM | POA: Diagnosis not present

## 2022-11-30 DIAGNOSIS — Z83719 Family history of colon polyps, unspecified: Secondary | ICD-10-CM

## 2022-11-30 DIAGNOSIS — Z800 Family history of malignant neoplasm of digestive organs: Secondary | ICD-10-CM

## 2022-11-30 MED ORDER — LINACLOTIDE 145 MCG PO CAPS: 145.0000 ug | ORAL_CAPSULE | Freq: Every day | ORAL | 1 refills | Status: DC

## 2022-11-30 NOTE — Patient Instructions (Signed)
I am providing you with some samples of Linzess today.  You should take 1 tablet 30 minutes prior to breakfast.  How to take Linzess: Once a day every day on empty stomach, at least 30 minutes before your first meal of the day. It is best to keep medications at a stable temperature Medication is best kept in its original bottle with the disket present.  It is a medication that is meant for everyday use and not to be used as needed.   What to expect: Constipation relief is typically felt in about 1 week Relief of abdominal pain, discomfort, and bloating begins in about 1 week with symptoms typically improving over 12 weeks and beyond. Diarrhea is most common side effect and typically begins within the first 2 weeks and can take 3-4 weeks to resolve It would be helpful to begin treatment over the weekend or when you can be closer to a bathroom   You can go to [email or web address] for patient support and sign up for daily medication reminders.   Continue fiber supplements.   Please check with your insurance to see if they will cover a high risk screening colonoscopy given your family history of colon cancer and colon polyps at a young age.  Continue taking your omeprazole 40 mg once daily and avoiding your food triggers.  Please let me know how you are doing in a few weeks in regards to your constipation or if there is any issues obtaining Linzess prescription.  If we end up needing to send an alternative we will do that.  It was a pleasure to see you today. I want to create trusting relationships with patients. If you receive a survey regarding your visit,  I greatly appreciate you taking time to fill this out on paper or through your MyChart. I value your feedback.  Brooke Bonito, MSN, FNP-BC, AGACNP-BC [facility]

## 2022-11-30 NOTE — Progress Notes (Signed)
GI Office Note    Referring Provider: Gilmore Laroche, FNP Primary Care Physician:  Gilmore Laroche, FNP  Primary Gastroenterologist: Gerrit Friends.Rourk, MD  Chief Complaint   Chief Complaint  Patient presents with   Abdominal Pain    Tender on left side. Always has a stomach ache and is constipated    History of Present Illness   Vickie Craig is a 22 y.o. female presenting today at the request of Gilmore Laroche, FNP for abdominal pain and constipation.  Visit with PCP 10/21/22 reporting generalized abdominal pain since Thanksgiving.  Negative rebound tenderness, psoas sign, and negative obturator sign.  Felt as though symptoms likely due to increased stomach acid.  Patient reported family history of colon cancer.  Has some mild relief with omeprazole 20 mg daily and it was increased to 40 mg daily.  Advised imaging studies due to ongoing discomfort in the abdomen and her reports of family history of colon cancer.  Constipation reported to be stable, having bowel movements every other day.  Labs 11/02/2022: A1c 5.2, TSH 2.95.  Normal CMP.  Unremarkable CBC.  Lipid panel with elevated LDL at 102, total triglycerides and cholesterol within normal limits.  Vitamin D 17.2.  Does not appear that CT scan was performed given lack of insurance approval as it felt as though abdomen/pelvis combination imaging was not appropriate.  Today: No BM every 1-2 weeks. Has tried stool softeners and laxatives since middle school. Was tacking miralax daily in middle school which did not help. Only laxative that has helped has been dulcolax. Has seen mucous in her stool if she has not had a BM in 2 weeks. Sometimes stools are very dark and sometimes a green color to her stool. When she wipes she has had a clear discharge at times. Has not seen any brbpr. Has to strain a lot. At times she feels like she needs to go but can't she knows the triggers that will help her go. Has incomplete emptying as well. Belly  gets hard after 2 weeks without a BM.Feels bloated all the time.   Has tried metamucil in the past. Takes protein shakes with fiber in it. Also takes over the counter fiber gummy as well.   States she had a KUB while in middle or high school and that's when she was started on miralax.   Had a BM yesterday but reports when she goes she keeps going and has pain that whole time. More often than not she has stomach pains. At times the pain feels like a stabbing pain and when she needs to defecate it is more intense and somites an intense cramp around her belly button.   Colon cancer in her maternal grandfather (60s, had to have colectomy and colostomy). Mother (16s) and maternal aunt (30s) have had polyps each time. Has a brother in his late 74s and he just had polyps as well this past January/February.   There was a period of time around thanksgiving or Christmas she was nausea and diarrhea. Was given reflux medication and Zofran.If she drink soda she may have some regurgitation and may have some burping and is still taking the omeprazole. Tries to stay away from fired foods. Has tried avoiding dairy products.   Does not know her dads side of the family or her grandparents.   No family history of IBD or celiac. Taking Vitamin D once weekly., wen she remembers she takes it.   Current Outpatient Medications  Medication Sig Dispense Refill  linaclotide (LINZESS) 145 MCG CAPS capsule Take 1 capsule (145 mcg total) by mouth daily before breakfast. 30 capsule 1   omeprazole (PRILOSEC) 40 MG capsule Take 1 capsule (40 mg total) by mouth daily. 30 capsule 3   ondansetron (ZOFRAN) 4 MG tablet Take 1 tablet (4 mg total) by mouth every 8 (eight) hours as needed for nausea or vomiting. 30 tablet 0   Vitamin D, Ergocalciferol, (DRISDOL) 1.25 MG (50000 UNIT) CAPS capsule Take 1 capsule (50,000 Units total) by mouth every 7 (seven) days. 10 capsule 2   No current facility-administered medications for this  visit.    History reviewed. No pertinent past medical history.  History reviewed. No pertinent surgical history.  History reviewed. No pertinent family history.  Allergies as of 11/30/2022   (No Known Allergies)   Social History   Socioeconomic History   Marital status: Single    Spouse name: Not on file   Number of children: Not on file   Years of education: Not on file   Highest education level: Not on file  Occupational History   Not on file  Tobacco Use   Smoking status: Never   Smokeless tobacco: Not on file  Substance and Sexual Activity   Alcohol use: No   Drug use: No   Sexual activity: Not on file  Other Topics Concern   Not on file  Social History Narrative   Not on file   Social Determinants of Health   Financial Resource Strain: Not on file  Food Insecurity: Not on file  Transportation Needs: Not on file  Physical Activity: Not on file  Stress: Not on file  Social Connections: Not on file  Intimate Partner Violence: Not on file     Review of Systems   Gen: Denies any fever, chills, fatigue, weight loss, lack of appetite.  CV: Denies chest pain, heart palpitations, peripheral edema, syncope.  Resp: Denies shortness of breath at rest or with exertion. Denies wheezing or cough.  GI: see HPI GU : Denies urinary burning, urinary frequency, urinary hesitancy MS: Denies joint pain, muscle weakness, cramps, or limitation of movement.  Derm: Denies rash, itching, dry skin Psych: Denies depression, anxiety, memory loss, and confusion Heme: Denies bruising, bleeding, and enlarged lymph nodes.  Physical Exam   BP 129/84 (BP Location: Right Arm, Patient Position: Sitting, Cuff Size: Large)   Pulse 69   Temp 98.6 F (37 C) (Temporal)   Ht 5\' 3"  (1.6 m)   Wt 243 lb 12.8 oz (110.6 kg)   LMP 11/26/2022 (Approximate)   SpO2 99%   BMI 43.19 kg/m   General:   Alert and oriented. Pleasant and cooperative. Well-nourished and well-developed.  Head:   Normocephalic and atraumatic. Eyes:  Without icterus, sclera clear and conjunctiva pink.  Ears:  Normal auditory acuity. Mouth:  No deformity or lesions, oral mucosa pink.  Lungs:  Clear to auscultation bilaterally. No wheezes, rales, or rhonchi. No distress.  Heart:  S1, S2 present without murmurs appreciated.  Abdomen:  +BS, soft, non-distended.  TTP to mid lower abdomen and left-sided abdomen.  Vague/mild tenderness to epigastrium.  No HSM noted. No guarding or rebound. No masses appreciated.  Rectal:  Deferred  Msk:  Symmetrical without gross deformities. Normal posture. Extremities:  Without edema. Neurologic:  Alert and  oriented x4;  grossly normal neurologically. Skin:  Intact without significant lesions or rashes. Psych:  Alert and cooperative. Normal mood and affect.  Assessment   ADRIALYS PERROTTO is a  22 y.o. female with a history of vitamin D deficiency presenting today for evaluation of abdominal pain and constipation.  Abdominal pain, constipation: She has dealt with lower abdominal pain on almost daily basis for many years that worsens with the need to have a bowel movement.  Since middle school she has had about 1 bowel movement every 1-2 weeks and this has been normal for her.  She has tried Metamucil and MiraLAX in the past without good relief.  Adds extra fiber in her diet with her protein shakes.  She suffers from straining and incomplete emptying as well as bloating.  Recent lab work with normal TSH and electrolytes.  Symptoms consistent with IBS therefore I have advised for her to try Linzess 145 mcg daily and provided samples today.  We discussed potential washout period. Will send in prescription to see if this is covered by insurance, if not we will trial the recommended alternative such as Amitiza or Trulance.  She is to call or send a MyChart message with a progress report in a couple weeks.  GERD: Previously was experiencing some nausea and vomiting a few months ago  and has some occasional reflux symptoms but right now they are fairly well-controlled on omeprazole 40 mg once daily.  Has been avoiding fried foods and spicy foods.  Trigger for her is also carbonated beverages.  Family history of colon polyps and colon cancer: History of colon cancer in her maternal grandfather in his 15s requiring colectomy and colostomy bag.  Her mother had colon polyps starting in her 60s and maternal aunt in her 69s.  Her older brother who was in his late 58s recently had a colonoscopy this year and had multiple polyps as well.  Given her strong family history and her issues with constipation she would benefit from a colonoscopy given her family history.  We discussed scheduling colonoscopy however she would like to check with insurance to be sure this was covered prior to proceeding.  PLAN   Linzess 145 mcg daily. Samples provided.  Gas ex as needed.  Advised progress report in 2-2 weeks. Continue omeprazole 40 mg daily GERD diet Will plan for colonoscopy if covered by insurance, she will call to confirm Continue fiber supplements. Follow up in 2 months.     Brooke Bonito, MSN, FNP-BC, AGACNP-BC [facility]

## 2022-12-01 ENCOUNTER — Telehealth: Payer: Self-pay | Admitting: *Deleted

## 2022-12-01 NOTE — Telephone Encounter (Signed)
[employer] and [employer] called and states that [employer] is not cover. She can try lactulose, lubiprostone, trulance or movantik. The colonoscopy will not be covered until she is 43 or until she meet her deductible first.

## 2022-12-02 ENCOUNTER — Encounter: Payer: Self-pay | Admitting: *Deleted

## 2022-12-02 ENCOUNTER — Ambulatory Visit: Payer: BC Managed Care – PPO | Admitting: Family Medicine

## 2022-12-02 ENCOUNTER — Other Ambulatory Visit: Payer: Self-pay | Admitting: Gastroenterology

## 2022-12-02 MED ORDER — LUBIPROSTONE 8 MCG PO CAPS: 8.0000 ug | ORAL_CAPSULE | Freq: Two times a day (BID) | ORAL | 2 refills | Status: DC

## 2022-12-02 NOTE — Telephone Encounter (Signed)
LMOVM to call back  Also spoke with CM and made aware since not know what type of polyps her brother and mother had, she would really be diagnostic also with change in bowels with constipation and lower abd pain.  Pt would have to meet deductible and would have to speak with insurance about cost.

## 2022-12-02 NOTE — Telephone Encounter (Signed)
Spoke to [place] and she states it will not cover due to age. Sent pt a [facility] [facility]ologist.

## 2022-12-07 ENCOUNTER — Other Ambulatory Visit: Payer: Self-pay | Admitting: Gastroenterology

## 2022-12-07 ENCOUNTER — Telehealth: Payer: Self-pay | Admitting: *Deleted

## 2022-12-07 MED ORDER — TRULANCE 3 MG PO TABS: 3.0000 mg | ORAL_TABLET | Freq: Every day | ORAL | 2 refills | Status: AC

## 2022-12-07 NOTE — Telephone Encounter (Signed)
Noted  

## 2022-12-07 NOTE — Telephone Encounter (Signed)
Received denial letter for lubiprostone. Insurance states she needs to try and fail trulance. Please send trulance to pharmacy.

## 2022-12-11 ENCOUNTER — Encounter: Payer: Self-pay | Admitting: *Deleted

## 2022-12-15 ENCOUNTER — Telehealth: Payer: Self-pay | Admitting: *Deleted

## 2022-12-15 ENCOUNTER — Encounter: Payer: Self-pay | Admitting: *Deleted

## 2022-12-15 NOTE — Telephone Encounter (Signed)
Received approval letter for Trulance. Sent copy to scan center.  

## 2022-12-24 ENCOUNTER — Encounter: Payer: Self-pay | Admitting: Gastroenterology

## 2023-04-14 ENCOUNTER — Ambulatory Visit: Payer: BC Managed Care – PPO | Admitting: Gastroenterology

## 2023-07-22 DIAGNOSIS — H6692 Otitis media, unspecified, left ear: Secondary | ICD-10-CM | POA: Diagnosis not present

## 2023-07-22 DIAGNOSIS — R519 Headache, unspecified: Secondary | ICD-10-CM | POA: Diagnosis not present

## 2023-07-22 DIAGNOSIS — H6691 Otitis media, unspecified, right ear: Secondary | ICD-10-CM | POA: Diagnosis not present

## 2023-07-22 DIAGNOSIS — J9801 Acute bronchospasm: Secondary | ICD-10-CM | POA: Diagnosis not present

## 2023-07-22 DIAGNOSIS — H6693 Otitis media, unspecified, bilateral: Secondary | ICD-10-CM | POA: Diagnosis not present

## 2023-07-22 DIAGNOSIS — J209 Acute bronchitis, unspecified: Secondary | ICD-10-CM | POA: Diagnosis not present

## 2023-10-26 DIAGNOSIS — R070 Pain in throat: Secondary | ICD-10-CM | POA: Diagnosis not present

## 2023-10-26 DIAGNOSIS — H6501 Acute serous otitis media, right ear: Secondary | ICD-10-CM | POA: Diagnosis not present

## 2023-10-26 DIAGNOSIS — R0982 Postnasal drip: Secondary | ICD-10-CM | POA: Diagnosis not present

## 2023-10-26 DIAGNOSIS — J0100 Acute maxillary sinusitis, unspecified: Secondary | ICD-10-CM | POA: Diagnosis not present

## 2024-02-29 DIAGNOSIS — D225 Melanocytic nevi of trunk: Secondary | ICD-10-CM | POA: Diagnosis not present

## 2024-04-04 DIAGNOSIS — R070 Pain in throat: Secondary | ICD-10-CM | POA: Diagnosis not present

## 2024-04-04 DIAGNOSIS — H6693 Otitis media, unspecified, bilateral: Secondary | ICD-10-CM | POA: Diagnosis not present

## 2024-04-04 DIAGNOSIS — Z6841 Body Mass Index (BMI) 40.0 and over, adult: Secondary | ICD-10-CM | POA: Diagnosis not present

## 2024-04-04 DIAGNOSIS — J060 Acute laryngopharyngitis: Secondary | ICD-10-CM | POA: Diagnosis not present

## 2024-04-14 ENCOUNTER — Ambulatory Visit

## 2024-06-22 DIAGNOSIS — Z789 Other specified health status: Secondary | ICD-10-CM | POA: Diagnosis not present

## 2024-06-22 DIAGNOSIS — Z01419 Encounter for gynecological examination (general) (routine) without abnormal findings: Secondary | ICD-10-CM | POA: Diagnosis not present

## 2024-06-22 DIAGNOSIS — Z124 Encounter for screening for malignant neoplasm of cervix: Secondary | ICD-10-CM | POA: Diagnosis not present

## 2024-06-22 DIAGNOSIS — Z6841 Body Mass Index (BMI) 40.0 and over, adult: Secondary | ICD-10-CM | POA: Diagnosis not present

## 2024-07-03 DIAGNOSIS — E288 Other ovarian dysfunction: Secondary | ICD-10-CM | POA: Diagnosis not present

## 2024-07-03 DIAGNOSIS — Z3181 Encounter for male factor infertility in female patient: Secondary | ICD-10-CM | POA: Diagnosis not present

## 2024-07-03 DIAGNOSIS — Z319 Encounter for procreative management, unspecified: Secondary | ICD-10-CM | POA: Diagnosis not present
